# Patient Record
Sex: Female | Born: 1975 | Race: White | Hispanic: Yes | Marital: Single | Smoking: Never smoker
Health system: Southern US, Community
[De-identification: ages and names within clinical notes are randomized; demographics above are authoritative.]

## PROBLEM LIST (undated history)

## (undated) DIAGNOSIS — I10 Essential (primary) hypertension: Secondary | ICD-10-CM

## (undated) HISTORY — PX: TUBAL LIGATION: SHX77

## (undated) HISTORY — PX: ABDOMINAL HYSTERECTOMY: SHX81

## (undated) HISTORY — PX: BREAST SURGERY: SHX581

## (undated) HISTORY — PX: ABDOMINAL SURGERY: SHX537

---

## 2008-03-18 HISTORY — PX: REDUCTION MAMMAPLASTY: SUR839

## 2017-04-20 ENCOUNTER — Other Ambulatory Visit: Payer: Self-pay

## 2017-04-20 ENCOUNTER — Emergency Department (HOSPITAL_BASED_OUTPATIENT_CLINIC_OR_DEPARTMENT_OTHER)
Admission: EM | Admit: 2017-04-20 | Discharge: 2017-04-20 | Disposition: A | Discharge status: Home / Self Care | Payer: BLUE CROSS/BLUE SHIELD | Attending: Emergency Medicine | Admitting: Emergency Medicine

## 2017-04-20 ENCOUNTER — Encounter (HOSPITAL_BASED_OUTPATIENT_CLINIC_OR_DEPARTMENT_OTHER): Payer: Self-pay | Admitting: Emergency Medicine

## 2017-04-20 DIAGNOSIS — G5621 Lesion of ulnar nerve, right upper limb: Secondary | ICD-10-CM | POA: Diagnosis not present

## 2017-04-20 DIAGNOSIS — R200 Anesthesia of skin: Secondary | ICD-10-CM | POA: Diagnosis present

## 2017-04-20 DIAGNOSIS — I10 Essential (primary) hypertension: Secondary | ICD-10-CM | POA: Insufficient documentation

## 2017-04-20 HISTORY — DX: Essential (primary) hypertension: I10

## 2017-04-20 NOTE — Discharge Instructions (Addendum)
Take Tylenol 1000 mg 4 times a day for 1 week. This is the maximum dose of Tylenol (acetminophen) you can take from all sources. Please check other over-the-counter medications and prescriptions to ensure you are not taking other medications that contain acetaminophen.  You may also take ibuprofen 400 mg 6 times a day alternating with or at the same time as tylenol.

## 2017-04-20 NOTE — ED Provider Notes (Signed)
[facility] EMERGENCY DEPARTMENT Provider Note   CSN: [id number] Arrival date & time: 04/20/17  1132     History   Chief Complaint Chief Complaint  Patient presents with  . Hand Problem    HPI Jean HailstoneYvette Kruser is a 42 y.o. female.  HPI  42 year old female with recent finding of hypertension presents with concern for left arm numbness in her fourth and fifth fingers.  2 months ago.  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  4 days ago, developed numbness of the left upper extremity extending from the elbow down through her fourth and fifth fingers.  Reports that the symptoms come and go, they are worse with bending her fingers, worse while she is driving, and sometimes she will wake up in the morning with symptoms.  Reports sensation of numbness as well as aching in that area of her arm.  Denies history of trauma, neck pain, fevers, chest pain, shortness of breath, other areas of numbness or weakness.  Reports she does not have weakness in her hand, but does report it feels "tired at times"   Past Medical History:  Diagnosis Date  . Hypertension     There are no active problems to display for this patient.   Past Surgical History:  Procedure Laterality Date  . ABDOMINAL HYSTERECTOMY    . ABDOMINAL SURGERY    . BREAST SURGERY      OB History    No data available       Home Medications    Prior to Admission medications   Not on File    Family History History reviewed. No pertinent family history.  Social History Social History   Tobacco Use  . Smoking status: Never Smoker  . Smokeless tobacco: Never Used  Substance Use Topics  . Alcohol use: Yes    Frequency: Never  . Drug use: No     Allergies   Adhesive [tape] and Penicillins   Review of Systems Review of Systems  Constitutional: Negative for fever.  HENT: Negative for sore throat.   Eyes: Negative for visual disturbance.  Respiratory: Negative  for cough and shortness of breath.   Cardiovascular: Negative for chest pain.  Gastrointestinal: Negative for abdominal pain, nausea and vomiting.  Genitourinary: Negative for difficulty urinating.  Musculoskeletal: Positive for arthralgias. Negative for back pain and neck pain.  Skin: Negative for rash.  Neurological: Positive for numbness. Negative for dizziness, syncope, facial asymmetry, weakness, light-headedness and headaches.     Physical Exam Updated Vital Signs BP (!) 148/105 (BP Location: Right Arm)   Pulse 72   Temp 98.5 F (36.9 C) (Oral)   Resp 18   Ht 5\' 3"  (1.6 m)   Wt 98 kg (216 lb)   SpO2 100%   BMI 38.26 kg/m   Physical Exam  Constitutional: She is oriented to person, place, and time. She appears well-developed and well-nourished. No distress.  HENT:  Head: Normocephalic and atraumatic.  Eyes: Conjunctivae and EOM are normal.  Neck: Normal range of motion.  Cardiovascular: Normal rate, regular rhythm, normal heart sounds and intact distal pulses. Exam reveals no gallop and no friction rub.  No murmur heard. Pulmonary/Chest: Effort normal and breath sounds normal. No respiratory distress. She has no wheezes. She has no rales.  Abdominal: Soft. She exhibits no distension. There is no tenderness. There is no guarding.  Musculoskeletal: She exhibits no edema or tenderness.  Neurological: She is alert and oriented to person,  place, and time. She has normal strength. No cranial nerve deficit or sensory deficit. She displays a negative Romberg sign. Coordination and gait normal. GCS eye subscore is 4. GCS verbal subscore is 5. GCS motor subscore is 6.  5/5 strength finger abduction, opponens, wrist extension and flexion, grip strength, elbow flexion/extension, arm flexion  Normal LE strength  Skin: Skin is warm and dry. No rash noted. She is not diaphoretic. No erythema.  Nursing note and vitals reviewed.    ED Treatments / Results  Labs (all labs ordered are  listed, but only abnormal results are displayed) Labs Reviewed - No data to display  EKG  EKG Interpretation None       Radiology No results found.  Procedures Procedures (including critical care time)  Medications Ordered in ED Medications - No data to display   Initial Impression / Assessment and Plan / ED Course  I have reviewed the triage vital signs and the nursing notes.  Pertinent labs & imaging results that were available during my care of the patient were reviewed by me and considered in my medical decision making (see chart for details).     42 year old female with recent finding of hypertension presents with concern for left arm numbness in her fourth and fifth fingers.  Patient denies neck pain or trauma, low suspicion for cervical spine fracture.  She has good strength and exam, doubt significant cord compression.  No chest pain, no dyspnea, no other neurologic symptoms and have low suspicion for MI, CVA by hx of physical.  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  Recommend ibuprofen, Tylenol, ice over the elbow, as well as rest.  Patient reports blood pressures have been high in the last several times she is checked, does not have a primary care doctor in the area.  Will start low-dose amlodipine, recommend she follow-up with the primary care doctor soon as possible. Patient discharged in stable condition with understanding of reasons to return.    Final Clinical Impressions(s) / ED Diagnoses   Final diagnoses:  Ulnar neuropathy of right upper extremity    ED Discharge Orders    None       Alvira Monday, MD 04/20/17 1659

## 2017-04-20 NOTE — ED Triage Notes (Addendum)
Patient states that she is having numbness and "weird feeling" - to her left hand up to her left elbow region started about 4 days ago . Patient reports that she notes some swelling to her left hand. Was recently had the flu. Patient states that it gets worse when she lifts her hand up and better when it hands down

## 2017-11-21 ENCOUNTER — Encounter (HOSPITAL_BASED_OUTPATIENT_CLINIC_OR_DEPARTMENT_OTHER): Payer: Self-pay | Admitting: *Deleted

## 2017-11-21 ENCOUNTER — Emergency Department (HOSPITAL_BASED_OUTPATIENT_CLINIC_OR_DEPARTMENT_OTHER): Payer: BLUE CROSS/BLUE SHIELD

## 2017-11-21 ENCOUNTER — Ambulatory Visit (HOSPITAL_BASED_OUTPATIENT_CLINIC_OR_DEPARTMENT_OTHER)
Admission: EM | Admit: 2017-11-21 | Discharge: 2017-11-22 | Disposition: A | Discharge status: Home / Self Care | Payer: BLUE CROSS/BLUE SHIELD | Attending: Emergency Medicine | Admitting: Emergency Medicine

## 2017-11-21 ENCOUNTER — Other Ambulatory Visit: Payer: Self-pay

## 2017-11-21 ENCOUNTER — Encounter (HOSPITAL_COMMUNITY)
Admission: EM | Disposition: A | Discharge status: Home / Self Care | Payer: Self-pay | Source: Home / Self Care | Attending: Emergency Medicine

## 2017-11-21 DIAGNOSIS — Z79899 Other long term (current) drug therapy: Secondary | ICD-10-CM | POA: Insufficient documentation

## 2017-11-21 DIAGNOSIS — I10 Essential (primary) hypertension: Secondary | ICD-10-CM | POA: Insufficient documentation

## 2017-11-21 DIAGNOSIS — N83511 Torsion of right ovary and ovarian pedicle: Secondary | ICD-10-CM | POA: Diagnosis not present

## 2017-11-21 DIAGNOSIS — N838 Other noninflammatory disorders of ovary, fallopian tube and broad ligament: Secondary | ICD-10-CM | POA: Diagnosis not present

## 2017-11-21 DIAGNOSIS — N83519 Torsion of ovary and ovarian pedicle, unspecified side: Secondary | ICD-10-CM | POA: Diagnosis present

## 2017-11-21 LAB — URINALYSIS, ROUTINE W REFLEX MICROSCOPIC
BILIRUBIN URINE: NEGATIVE
GLUCOSE, UA: NEGATIVE mg/dL
Hgb urine dipstick: NEGATIVE
KETONES UR: NEGATIVE mg/dL
LEUKOCYTES UA: NEGATIVE
NITRITE: NEGATIVE
PROTEIN: NEGATIVE mg/dL
Specific Gravity, Urine: 1.015 (ref 1.005–1.030)
pH: 7.5 (ref 5.0–8.0)

## 2017-11-21 LAB — BASIC METABOLIC PANEL
ANION GAP: 9 (ref 5–15)
BUN: 12 mg/dL (ref 6–20)
CALCIUM: 8.5 mg/dL — AB (ref 8.9–10.3)
CO2: 24 mmol/L (ref 22–32)
CREATININE: 0.54 mg/dL (ref 0.44–1.00)
Chloride: 105 mmol/L (ref 98–111)
GFR calc Af Amer: >60 mL/min (ref >60)
GLUCOSE: 82 mg/dL (ref 70–99)
Potassium: 3.7 mmol/L (ref 3.5–5.1)
Sodium: 138 mmol/L (ref 135–145)

## 2017-11-21 LAB — CBC
HCT: 39.6 % (ref 36.0–46.0)
HEMOGLOBIN: 13.2 g/dL (ref 12.0–15.0)
MCH: 30.8 pg (ref 26.0–34.0)
MCHC: 33.3 g/dL (ref 30.0–36.0)
MCV: 92.5 fL (ref 78.0–100.0)
PLATELETS: 255 10*3/uL (ref 150–400)
RBC: 4.28 MIL/uL (ref 3.87–5.11)
RDW: 12.9 % (ref 11.5–15.5)
WBC: 13 10*3/uL — ABNORMAL HIGH (ref 4.0–10.5)

## 2017-11-21 LAB — PREGNANCY, URINE: PREG TEST UR: NEGATIVE

## 2017-11-21 SURGERY — OOPHORECTOMY, LAPAROSCOPIC
Anesthesia: General | Laterality: Right

## 2017-11-21 MED ORDER — PROPOFOL 10 MG/ML IV BOLUS
INTRAVENOUS | Status: AC
  Filled 2017-11-21: qty 20

## 2017-11-21 MED ORDER — MORPHINE SULFATE (PF) 4 MG/ML IV SOLN
4.0000 mg | Freq: Once | INTRAVENOUS | Status: AC
  Administered 2017-11-21: 4 mg via INTRAVENOUS
  Filled 2017-11-21: qty 1

## 2017-11-21 MED ORDER — MIDAZOLAM HCL 2 MG/2ML IJ SOLN
INTRAMUSCULAR | Status: AC
  Filled 2017-11-21: qty 2

## 2017-11-21 MED ORDER — LIDOCAINE HCL (CARDIAC) PF 100 MG/5ML IV SOSY
PREFILLED_SYRINGE | INTRAVENOUS | Status: AC
  Filled 2017-11-21: qty 5

## 2017-11-21 MED ORDER — ONDANSETRON HCL 4 MG/2ML IJ SOLN
4.0000 mg | Freq: Once | INTRAMUSCULAR | Status: AC
  Administered 2017-11-21: 4 mg via INTRAVENOUS
  Filled 2017-11-21: qty 2

## 2017-11-21 MED ORDER — ONDANSETRON HCL 4 MG/2ML IJ SOLN
INTRAMUSCULAR | Status: AC
  Filled 2017-11-21: qty 2

## 2017-11-21 MED ORDER — ROCURONIUM BROMIDE 100 MG/10ML IV SOLN
INTRAVENOUS | Status: AC
  Filled 2017-11-21: qty 1

## 2017-11-21 MED ORDER — FENTANYL CITRATE (PF) 250 MCG/5ML IJ SOLN
INTRAMUSCULAR | Status: AC
  Filled 2017-11-21: qty 5

## 2017-11-21 MED ORDER — LACTATED RINGERS IV SOLN
INTRAVENOUS | Status: DC
  Administered 2017-11-22 (×2): via INTRAVENOUS

## 2017-11-21 MED ORDER — FENTANYL CITRATE (PF) 100 MCG/2ML IJ SOLN
INTRAMUSCULAR | Status: AC
  Filled 2017-11-21: qty 2

## 2017-11-21 SURGICAL SUPPLY — 31 items
APPLICATOR ARISTA FLEXITIP XL (MISCELLANEOUS) ×4 IMPLANT
BLADE SURG 15 STRL LF C SS BP (BLADE) ×2 IMPLANT
BLADE SURG 15 STRL SS (BLADE) ×2
DERMABOND ADVANCED (GAUZE/BANDAGES/DRESSINGS) ×2
DERMABOND ADVANCED .7 DNX12 (GAUZE/BANDAGES/DRESSINGS) ×2 IMPLANT
DRSG OPSITE POSTOP 3X4 (GAUZE/BANDAGES/DRESSINGS) ×4 IMPLANT
DURAPREP 26ML APPLICATOR (WOUND CARE) ×4 IMPLANT
GLOVE BIOGEL PI IND STRL 6.5 (GLOVE) ×4 IMPLANT
GLOVE BIOGEL PI IND STRL 7.0 (GLOVE) ×4 IMPLANT
GLOVE BIOGEL PI INDICATOR 6.5 (GLOVE) ×4
GLOVE BIOGEL PI INDICATOR 7.0 (GLOVE) ×4
GLOVE SURG SS PI 6.0 STRL IVOR (GLOVE) ×4 IMPLANT
GOWN STRL REUS W/TWL LRG LVL3 (GOWN DISPOSABLE) ×8 IMPLANT
HEMOSTAT ARISTA ABSORB 3G PWDR (MISCELLANEOUS) ×4 IMPLANT
NS IRRIG 1000ML POUR BTL (IV SOLUTION) ×4 IMPLANT
PACK LAPAROSCOPY BASIN (CUSTOM PROCEDURE TRAY) ×4 IMPLANT
PACK TRENDGUARD 450 HYBRID PRO (MISCELLANEOUS) ×2 IMPLANT
POUCH SPECIMEN RETRIEVAL 10MM (ENDOMECHANICALS) ×4 IMPLANT
PROTECTOR NERVE ULNAR (MISCELLANEOUS) ×8 IMPLANT
SET IRRIG TUBING LAPAROSCOPIC (IRRIGATION / IRRIGATOR) ×4 IMPLANT
SHEARS HARMONIC ACE PLUS 36CM (ENDOMECHANICALS) ×4 IMPLANT
SLEEVE XCEL OPT CAN 5 100 (ENDOMECHANICALS) ×4 IMPLANT
SUT MNCRL AB 4-0 PS2 18 (SUTURE) ×4 IMPLANT
SUT VICRYL 0 UR6 27IN ABS (SUTURE) ×8 IMPLANT
TOWEL OR 17X24 6PK STRL BLUE (TOWEL DISPOSABLE) ×8 IMPLANT
TRAY FOLEY W/BAG SLVR 14FR (SET/KITS/TRAYS/PACK) ×4 IMPLANT
TRENDGUARD 450 HYBRID PRO PACK (MISCELLANEOUS) ×4
TROCAR BALLN 12MMX100 BLUNT (TROCAR) ×4 IMPLANT
TROCAR XCEL NON-BLD 5MMX100MML (ENDOMECHANICALS) ×4 IMPLANT
TUBING INSUF HEATED (TUBING) ×4 IMPLANT
WARMER LAPAROSCOPE (MISCELLANEOUS) ×4 IMPLANT

## 2017-11-21 NOTE — ED Triage Notes (Signed)
Right flank pain today.

## 2017-11-21 NOTE — ED Notes (Signed)
Tiffany, RN from [facility] was given report

## 2017-11-21 NOTE — ED Notes (Signed)
[facility] notified (Tammy) - transport to [facility] (Dr. Catalina Antigua accepting)

## 2017-11-21 NOTE — Anesthesia Preprocedure Evaluation (Addendum)
Anesthesia Evaluation  Patient identified by MRN, date of birth, ID band Patient awake    Reviewed: Allergy & Precautions, NPO status , Patient's Chart, lab work & pertinent test results  Airway Mallampati: I       Dental no notable dental hx. (+) Teeth Intact   Pulmonary neg pulmonary ROS,    Pulmonary exam normal breath sounds clear to auscultation       Cardiovascular hypertension, Normal cardiovascular exam Rhythm:Regular Rate:Normal     Neuro/Psych negative neurological ROS     GI/Hepatic negative GI ROS, Neg liver ROS,   Endo/Other  negative endocrine ROS  Renal/GU negative Renal ROS  negative genitourinary   Musculoskeletal negative musculoskeletal ROS (+)   Abdominal (+) + obese,   Peds  Hematology negative hematology ROS (+)   Anesthesia Other Findings   Reproductive/Obstetrics negative OB ROS                           Anesthesia Physical Anesthesia Plan  ASA: II  Anesthesia Plan: General   Post-op Pain Management:    Induction: Intravenous  PONV Risk Score and Plan: 4 or greater and Ondansetron, Dexamethasone, Scopolamine patch - Pre-op and Midazolam  Airway Management Planned: Oral ETT  Additional Equipment:   Intra-op Plan:   Post-operative Plan: Extubation in OR  Informed Consent: I have reviewed the patients History and Physical, chart, labs and discussed the procedure including the risks, benefits and alternatives for the proposed anesthesia with the patient or authorized representative who has indicated his/her understanding and acceptance.   Dental advisory given  Plan Discussed with: CRNA and Surgeon  Anesthesia Plan Comments:        Anesthesia Quick Evaluation

## 2017-11-21 NOTE — ED Provider Notes (Signed)
[facility] EMERGENCY DEPARTMENT Provider Note   CSN: [id number] Arrival date & time: 11/21/17  1658     History   Chief Complaint Chief Complaint  Patient presents with  . Flank Pain    HPI Jean Graves is a 42 y.o. female.  Who presents the emergency department chief complaint of right flank pain.  The patient works third shift and states around 1 PM she was awoken from sleep with right flank pain radiating into her right pelvis and urgency to urinate.  Patient states that the pain was just nagging at that time but became severe, stabbing and she has associated nausea without vomiting.  She had difficulty finding a comfortable position.  She denies hematuria, fevers, chills.  Nothing seems to make the pain worse or better.  She rates the pain at 7 out of 10.   She denies any known injuries.  It does not seem to be worse with movement or position.   HPI  Past Medical History:  Diagnosis Date  . Hypertension     There are no active problems to display for this patient.   Past Surgical History:  Procedure Laterality Date  . ABDOMINAL HYSTERECTOMY    . ABDOMINAL SURGERY    . BREAST SURGERY       OB History   None      Home Medications    Prior to Admission medications   Not on File    Family History No family history on file.  Social History Social History   Tobacco Use  . Smoking status: Never Smoker  . Smokeless tobacco: Never Used  Substance Use Topics  . Alcohol use: Yes    Frequency: Never  . Drug use: No     Allergies   Adhesive [tape] and Penicillins   Review of Systems Review of Systems Ten systems reviewed and are negative for acute change, except as noted in the HPI.    Physical Exam Updated Vital Signs BP (!) 160/101   Pulse 77   Temp 98.7 F (37.1 C) (Oral)   Resp 20   Ht 5\' 3"  (1.6 m)   Wt 91.4 kg   SpO2 99%   BMI 35.69 kg/m   Physical Exam  Constitutional: She is oriented to person, place, and time. She  appears well-developed and well-nourished. No distress.  HENT:  Head: Normocephalic and atraumatic.  Eyes: Conjunctivae are normal. No scleral icterus.  Neck: Normal range of motion.  Cardiovascular: Normal rate, regular rhythm and normal heart sounds. Exam reveals no gallop and no friction rub.  No murmur heard. Pulmonary/Chest: Effort normal and breath sounds normal. No respiratory distress.  Abdominal: Soft. Bowel sounds are normal. She exhibits no distension and no mass. There is tenderness. There is no guarding.  No CVA tenderness Tenderness in the right upper and lower abdominal quadrants no rebound  Neurological: She is alert and oriented to person, place, and time.  Skin: Skin is warm and dry. She is not diaphoretic.  Psychiatric: Her behavior is normal.  Nursing note and vitals reviewed.    ED Treatments / Results  Labs (all labs ordered are listed, but only abnormal results are displayed) Labs Reviewed  URINALYSIS, ROUTINE W REFLEX MICROSCOPIC    EKG None  Radiology No results found.  Procedures Procedures (including critical care time)  Medications Ordered in ED Medications - No data to display   Initial Impression / Assessment and Plan / ED Course  I have reviewed the triage vital signs  and the nursing notes.  Pertinent labs & imaging results that were available during my care of the patient were reviewed by me and considered in my medical decision making (see chart for details).   Clinical Course as of Nov 23 6  Fri Nov 21, 2017  1943 US Pelvis Complete [AH]  2029 Dr. Rennis Petty radiology   [AH]    Clinical Course User Index [AH] Arthor Captain, PA-C   42 year old female with flank and abdominal pain.The differential diagnosis includes renal colic,  Cholelithiasis, cholecystitis, hepatitis, eg, viral, alcoholic, toxic, cholangitis or choledocholithiasis, peptic ulcer disease (duodenal), pancreatitis, functional or nonulcer dyspepsia, liver abscess, liver,  pancreatic, or biliary tract cancer, ischemic hepatopathy (shock liver), hepatic vein obstruction (Budd-Chiari syndrome), right lower lobe pneumonia, pyelonephritis, urinary calculi,  Fitz-Hugh-Curtis syndrome (with pelvic inflammatory disease), liver cell adenoma, herpes zoster, trauma or musculoskeletal pain, herniated disk, abdominal abscess , intestinal ischemia, physical or sexual abuse, ectopic pregnancy, IUP, Mittelschmerz, appendicitis, cholecystitis, ovarian cyst/torsion, threatened/ievitable abortion, PID, endometriosis, molar pregnancy, UTI/renal colic, heterotopic pregnancy, IBD, corpus luteum cyst  Patient's CT scan concerning for torsion  And patient's pelvic ultrasound confirms that diagnosis.  Personally reviewed the images and agree with radiologic interpretation.  I discussed the findings with the patient and I also discussed them with Dr. Catalina Antigua who asked that she be sent her for emergently for surgical intervention.  Patient vital signs are stable.  She she has a slightly elevated white count her her lab work is otherwise unremarkable.  Patient seen and shared visit with Dr. Clarene Duke..  Final Clinical Impressions(s) / ED Diagnoses   Final diagnoses:  None    ED Discharge Orders    None       Arthor Captain, PA-C 11/22/17 0014    Little, Ambrose Finland, MD 11/22/17 2351

## 2017-11-21 NOTE — ED Notes (Signed)
Patient transported to Ultrasound 

## 2017-11-21 NOTE — [facility] Note (Signed)
Started having pain at 1300 today on the right side.  Hysterectomy July 2018.

## 2017-11-21 NOTE — ED Notes (Signed)
Pt on cardiac monitor and auto VS 

## 2017-11-21 NOTE — H&P (Signed)
Jean Graves is an 42 y.o. female [phone] transferred from [facility] for the evaluation of an ovarian torsion. Patient reports onset of pain around 1pm which awakened her from sleep. The pain was throbbing in nature initially and associated with nausea. She states that the intensity of the pain worsened making it difficult to find a comfortable position.   Pertinent Gynecological History: Menses: s/p hysterectomy Bleeding: none DES exposure: denies Blood transfusions: none Sexually transmitted diseases: no past history Previous GYN Procedures: RAVH    Menstrual History: No LMP recorded. Patient has had a hysterectomy.    Past Medical History:  Diagnosis Date  . Hypertension     Past Surgical History:  Procedure Laterality Date  . ABDOMINAL HYSTERECTOMY    . ABDOMINAL SURGERY    . BREAST SURGERY    . TUBAL LIGATION      Family History  Problem Relation Age of Onset  . Diabetes Father   . Diabetes Maternal Grandmother     Social History:  reports that she has never smoked. She has never used smokeless tobacco. She reports that she drinks alcohol. She reports that she does not use drugs.  Allergies:  Allergies  Allergen Reactions  . Adhesive [Tape] Rash  . Penicillins Rash    No medications prior to admission.    ROS See pertinent in HPI Blood pressure (!) 158/92, pulse 69, temperature 98.2 F (36.8 C), temperature source Oral, resp. rate 20, height 5\' 3"  (1.6 m), weight 90.7 kg, SpO2 100 %. Physical Exam GENERAL: Well-developed, well-nourished female in no acute distress.  HEENT: Normocephalic, atraumatic. Sclerae anicteric.  NECK: Supple. Normal thyroid.  LUNGS: Clear to auscultation bilaterally.  HEART: Regular rate and rhythm. BREASTS: Symmetric in size. No palpable masses or lymphadenopathy, skin changes, or nipple drainage. ABDOMEN: Soft, nondistended, positive tenderness in lower abdomen right greater than left. No organomegaly. No  rebound EXTREMITIES: No cyanosis, clubbing, or edema, 2+ distal pulses.  Results for orders placed or performed during the hospital encounter of 11/21/17 (from the past 24 hour(s))  Urinalysis, Routine w reflex microscopic     Status: None   Collection Time: 11/21/17  5:10 PM  Result Value Ref Range   Color, Urine YELLOW YELLOW   APPearance CLEAR CLEAR   Specific Gravity, Urine 1.015 1.005 - 1.030   pH 7.5 5.0 - 8.0   Glucose, UA NEGATIVE NEGATIVE mg/dL   Hgb urine dipstick NEGATIVE NEGATIVE   Bilirubin Urine NEGATIVE NEGATIVE   Ketones, ur NEGATIVE NEGATIVE mg/dL   Protein, ur NEGATIVE NEGATIVE mg/dL   Nitrite NEGATIVE NEGATIVE   Leukocytes, UA NEGATIVE NEGATIVE  Pregnancy, urine     Status: None   Collection Time: 11/21/17  5:10 PM  Result Value Ref Range   Preg Test, Ur NEGATIVE NEGATIVE  Basic metabolic panel     Status: Abnormal   Collection Time: 11/21/17  5:56 PM  Result Value Ref Range   Sodium 138 135 - 145 mmol/L   Potassium 3.7 3.5 - 5.1 mmol/L   Chloride 105 98 - 111 mmol/L   CO2 24 22 - 32 mmol/L   Glucose, Bld 82 70 - 99 mg/dL   BUN 12 6 - 20 mg/dL   Creatinine, Ser 2.95 0.44 - 1.00 mg/dL   Calcium 8.5 (L) 8.9 - 10.3 mg/dL   GFR calc non Af Amer >60 >60 mL/min   GFR calc Af Amer >60 >60 mL/min   Anion gap 9 5 - 15  CBC  Status: Abnormal   Collection Time: 11/21/17  5:56 PM  Result Value Ref Range   WBC 13.0 (H) 4.0 - 10.5 K/uL   RBC 4.28 3.87 - 5.11 MIL/uL   Hemoglobin 13.2 12.0 - 15.0 g/dL   HCT 47.8 29.5 - 62.1 %   MCV 92.5 78.0 - 100.0 fL   MCH 30.8 26.0 - 34.0 pg   MCHC 33.3 30.0 - 36.0 g/dL   RDW 30.8 65.7 - 84.6 %   Platelets 255 150 - 400 K/uL    US Transvaginal Non-ob  Result Date: 11/21/2017 CLINICAL DATA:  Right flank pain. CT findings suspicious for right ovarian thrombosis and possible right ovarian torsion earlier today. EXAM: TRANSABDOMINAL AND TRANSVAGINAL ULTRASOUND OF PELVIS DOPPLER ULTRASOUND OF OVARIES TECHNIQUE: Both  transabdominal and transvaginal ultrasound examinations of the pelvis were performed. Transabdominal technique was performed for global imaging of the pelvis including uterus, ovaries, adnexal regions, and pelvic cul-de-sac. It was necessary to proceed with endovaginal exam following the transabdominal exam to visualize the right ovary and better visualize the uterus and left ovary. Color and duplex Doppler ultrasound was utilized to evaluate blood flow to the ovaries. COMPARISON:  Abdomen and pelvis CT obtained earlier today. FINDINGS: Uterus Surgically absent. Right ovary Measurements: 4.5 x 3.4 x 2.9 cm. Mildly heterogeneous. Almost no internal blood flow with color Doppler. Only a tiny amount of was seen in the central portion of the ovary. Left ovary Measurements: 3.4 x 2.6 x 2.2 cm. 2.4 x 2.1 x 2.0 cm oval cyst containing heterogeneous internal echoes. No internal blood flow within the cyst with color Doppler. Pulsed Doppler evaluation of both ovaries demonstrates normal low-resistance arterial and venous waveforms in the left ovary. There was minimal internal arterial and venous flow within the central right ovary. Other findings Trace amount of free peritoneal fluid adjacent to the right ovary. IMPRESSION: 1. Findings concerning for right ovarian torsion with near-complete absence of blood flow in the ovary. 2. 2.4 cm complex left ovarian cyst. This most likely represents a hemorrhagic cyst. An endometrioma could also have this appearance. A follow-up pelvic ultrasound is recommended in 3 months. 3. Status post hysterectomy. Critical Value/emergent results were called by telephone at the time of interpretation on 11/21/2017 at 8:29 pm to Francis Gaines, PA-C, who verbally acknowledged these results. Electronically Signed   By: Beckie Salts M.D.   On: 11/21/2017 20:32   US Pelvis Complete  Result Date: 11/21/2017 CLINICAL DATA:  Right flank pain. CT findings suspicious for right ovarian thrombosis and  possible right ovarian torsion earlier today. EXAM: TRANSABDOMINAL AND TRANSVAGINAL ULTRASOUND OF PELVIS DOPPLER ULTRASOUND OF OVARIES TECHNIQUE: Both transabdominal and transvaginal ultrasound examinations of the pelvis were performed. Transabdominal technique was performed for global imaging of the pelvis including uterus, ovaries, adnexal regions, and pelvic cul-de-sac. It was necessary to proceed with endovaginal exam following the transabdominal exam to visualize the right ovary and better visualize the uterus and left ovary. Color and duplex Doppler ultrasound was utilized to evaluate blood flow to the ovaries. COMPARISON:  Abdomen and pelvis CT obtained earlier today. FINDINGS: Uterus Surgically absent. Right ovary Measurements: 4.5 x 3.4 x 2.9 cm. Mildly heterogeneous. Almost no internal blood flow with color Doppler. Only a tiny amount of was seen in the central portion of the ovary. Left ovary Measurements: 3.4 x 2.6 x 2.2 cm. 2.4 x 2.1 x 2.0 cm oval cyst containing heterogeneous internal echoes. No internal blood flow within the cyst with color Doppler. Pulsed Doppler evaluation  of both ovaries demonstrates normal low-resistance arterial and venous waveforms in the left ovary. There was minimal internal arterial and venous flow within the central right ovary. Other findings Trace amount of free peritoneal fluid adjacent to the right ovary. IMPRESSION: 1. Findings concerning for right ovarian torsion with near-complete absence of blood flow in the ovary. 2. 2.4 cm complex left ovarian cyst. This most likely represents a hemorrhagic cyst. An endometrioma could also have this appearance. A follow-up pelvic ultrasound is recommended in 3 months. 3. Status post hysterectomy. Critical Value/emergent results were called by telephone at the time of interpretation on 11/21/2017 at 8:29 pm to Francis Gaines, PA-C, who verbally acknowledged these results. Electronically Signed   By: Beckie Salts M.D.   On: 11/21/2017  20:32   [place] Art/ven Flow Abd Pelv Doppler  Result Date: 11/21/2017 CLINICAL DATA:  Right flank pain. CT findings suspicious for right ovarian thrombosis and possible right ovarian torsion earlier today. EXAM: TRANSABDOMINAL AND TRANSVAGINAL ULTRASOUND OF PELVIS DOPPLER ULTRASOUND OF OVARIES TECHNIQUE: Both transabdominal and transvaginal ultrasound examinations of the pelvis were performed. Transabdominal technique was performed for global imaging of the pelvis including uterus, ovaries, adnexal regions, and pelvic cul-de-sac. It was necessary to proceed with endovaginal exam following the transabdominal exam to visualize the right ovary and better visualize the uterus and left ovary. Color and duplex Doppler ultrasound was utilized to evaluate blood flow to the ovaries. COMPARISON:  Abdomen and pelvis CT obtained earlier today. FINDINGS: Uterus Surgically absent. Right ovary Measurements: 4.5 x 3.4 x 2.9 cm. Mildly heterogeneous. Almost no internal blood flow with color Doppler. Only a tiny amount of was seen in the central portion of the ovary. Left ovary Measurements: 3.4 x 2.6 x 2.2 cm. 2.4 x 2.1 x 2.0 cm oval cyst containing heterogeneous internal echoes. No internal blood flow within the cyst with color Doppler. Pulsed Doppler evaluation of both ovaries demonstrates normal low-resistance arterial and venous waveforms in the left ovary. There was minimal internal arterial and venous flow within the central right ovary. Other findings Trace amount of free peritoneal fluid adjacent to the right ovary. IMPRESSION: 1. Findings concerning for right ovarian torsion with near-complete absence of blood flow in the ovary. 2. 2.4 cm complex left ovarian cyst. This most likely represents a hemorrhagic cyst. An endometrioma could also have this appearance. A follow-up pelvic ultrasound is recommended in 3 months. 3. Status post hysterectomy. Critical Value/emergent results were called by telephone at the time of  interpretation on 11/21/2017 at 8:29 pm to Francis Gaines, PA-C, who verbally acknowledged these results. Electronically Signed   By: Beckie Salts M.D.   On: 11/21/2017 20:32   Ct Renal Stone Study  Result Date: 11/21/2017 CLINICAL DATA:  Right flank pain awakening patient from sleep today. EXAM: CT ABDOMEN AND PELVIS WITHOUT CONTRAST TECHNIQUE: Multidetector CT imaging of the abdomen and pelvis was performed following the standard protocol without IV contrast. COMPARISON:  None. FINDINGS: Lower chest: Normal heart size.  Clear lung bases. Hepatobiliary: No focal liver abnormality is seen. No gallstones, gallbladder wall thickening, or biliary dilatation. Pancreas: Unremarkable. No pancreatic ductal dilatation or surrounding inflammatory changes. Spleen: Normal in size without focal abnormality. Adrenals/Urinary Tract: Normal bilateral adrenal glands. Punctate nonobstructing right upper and interpolar calculi measuring up to 2 mm. No hydroureteronephrosis. Left kidney is unremarkable save for minimal renovascular calcification in the interpolar aspect no hydroureteronephrosis. The urinary bladder is decompressed in appearance. Adjacent bilateral phleboliths are seen. Stomach/Bowel: Stomach is within normal limits.  Appendix appears normal. No evidence of bowel wall thickening, distention, or inflammatory changes. Vascular/Lymphatic: Tubular hyperdense structure outlined by edema is seen emanating from about the level of the right renal vein to the right ovary raising concern for right gonadal vein thrombosis. Enlargement of the right ovary to 3.1 x 4 x 2.9 cm also raises concern for possible torsion. The left ovary contains physiologic sized follicles and is normal normal in size and morphology. Reproductive: Status post hysterectomy. Enlargement of the right ovary associated with what may represent right gonadal vein thrombosis. Other: Trace free fluid in pelvis. Small small periumbilical fat containing hernia.  Musculoskeletal: No acute osseous abnormality. IMPRESSION: Hyperdense tubular structure emanating from an enlarged right ovary to the region of the right renal vein suspicious for right canal vein thrombosis outlined by edema and fluid. Enlargement of the right ovary raises concern for possible torsion and should be correlated with ultrasound. Further correlation for thrombosis can be performed with repeat CT and IV contrast enhancement. These results were called by telephone at the time of interpretation on 11/21/2017 at 6:37 pm to Dr. Clarene Duke for Arthor Captain, PA, who verbally acknowledged these results. Nonobstructing punctate right-sided and interpolar renal calculi. Electronically Signed   By: Tollie Eth M.D.   On: 11/21/2017 18:37    Assessment/Plan: 42 yo with radiologic findings suspicious for right ovarian torsion and abdominal pain - Discussed surgical management with diagnostic laparoscopy and possible right salpingo-oophorectomy - Risks, benefits and alternatives were explained including but not limited to risks of bleeding, infection and damage to adjacent organs. Patient verbalized understanding and all questions were answered. Consent signed  Kasumi Ditullio 11/21/2017, 11:13 PM

## 2017-11-22 ENCOUNTER — Emergency Department (HOSPITAL_COMMUNITY): Payer: BLUE CROSS/BLUE SHIELD | Admitting: Anesthesiology

## 2017-11-22 DIAGNOSIS — N83519 Torsion of ovary and ovarian pedicle, unspecified side: Secondary | ICD-10-CM

## 2017-11-22 LAB — TYPE AND SCREEN
ABO/RH(D): A POS
Antibody Screen: NEGATIVE

## 2017-11-22 LAB — ABO/RH: ABO/RH(D): A POS

## 2017-11-22 MED ORDER — GLYCOPYRROLATE 0.2 MG/ML IJ SOLN
INTRAMUSCULAR | Status: DC | PRN
  Administered 2017-11-22: 0.2 mg via INTRAVENOUS

## 2017-11-22 MED ORDER — FENTANYL CITRATE (PF) 100 MCG/2ML IJ SOLN
INTRAMUSCULAR | Status: DC | PRN
  Administered 2017-11-22: 50 ug via INTRAVENOUS
  Administered 2017-11-22 (×2): 100 ug via INTRAVENOUS

## 2017-11-22 MED ORDER — EPHEDRINE 5 MG/ML INJ
INTRAVENOUS | Status: AC
  Filled 2017-11-22: qty 10

## 2017-11-22 MED ORDER — ONDANSETRON HCL 4 MG/2ML IJ SOLN
INTRAMUSCULAR | Status: DC | PRN
  Administered 2017-11-22: 4 mg via INTRAVENOUS

## 2017-11-22 MED ORDER — BUPIVACAINE HCL (PF) 0.25 % IJ SOLN
INTRAMUSCULAR | Status: DC | PRN
  Administered 2017-11-22: 30 mL

## 2017-11-22 MED ORDER — DEXAMETHASONE SODIUM PHOSPHATE 10 MG/ML IJ SOLN
INTRAMUSCULAR | Status: AC
  Filled 2017-11-22: qty 1

## 2017-11-22 MED ORDER — DEXAMETHASONE SODIUM PHOSPHATE 10 MG/ML IJ SOLN
INTRAMUSCULAR | Status: DC | PRN
  Administered 2017-11-22: 10 mg via INTRAVENOUS

## 2017-11-22 MED ORDER — GLYCOPYRROLATE 0.2 MG/ML IJ SOLN
INTRAMUSCULAR | Status: AC
  Filled 2017-11-22: qty 1

## 2017-11-22 MED ORDER — PROMETHAZINE HCL 25 MG/ML IJ SOLN: 6.2500 mg | INTRAMUSCULAR | Status: DC | PRN

## 2017-11-22 MED ORDER — MEPERIDINE HCL 25 MG/ML IJ SOLN: 6.2500 mg | INTRAMUSCULAR | Status: DC | PRN

## 2017-11-22 MED ORDER — KETOROLAC TROMETHAMINE 30 MG/ML IJ SOLN
INTRAMUSCULAR | Status: DC | PRN
  Administered 2017-11-22: 30 mg via INTRAVENOUS

## 2017-11-22 MED ORDER — MIDAZOLAM HCL 2 MG/2ML IJ SOLN
INTRAMUSCULAR | Status: DC | PRN
  Administered 2017-11-22: 2 mg via INTRAVENOUS

## 2017-11-22 MED ORDER — SODIUM CHLORIDE 0.9 % IR SOLN
Status: DC | PRN
  Administered 2017-11-22: 3000 mL

## 2017-11-22 MED ORDER — IBUPROFEN 600 MG PO TABS: 600.0000 mg | ORAL_TABLET | Freq: Four times a day (QID) | ORAL | 3 refills | Status: AC | PRN

## 2017-11-22 MED ORDER — DOCUSATE SODIUM 100 MG PO CAPS: 100.0000 mg | ORAL_CAPSULE | Freq: Two times a day (BID) | ORAL | 2 refills | Status: AC | PRN

## 2017-11-22 MED ORDER — PROPOFOL 10 MG/ML IV BOLUS
INTRAVENOUS | Status: DC | PRN
  Administered 2017-11-22: 200 mg via INTRAVENOUS

## 2017-11-22 MED ORDER — SCOPOLAMINE 1 MG/3DAYS TD PT72
MEDICATED_PATCH | TRANSDERMAL | Status: DC | PRN
  Administered 2017-11-22: 1 via TRANSDERMAL

## 2017-11-22 MED ORDER — 0.9 % SODIUM CHLORIDE (POUR BTL) OPTIME
TOPICAL | Status: DC | PRN
  Administered 2017-11-22: 1000 mL

## 2017-11-22 MED ORDER — KETOROLAC TROMETHAMINE 30 MG/ML IJ SOLN: 30.0000 mg | Freq: Once | INTRAMUSCULAR | Status: DC | PRN

## 2017-11-22 MED ORDER — EPHEDRINE SULFATE 50 MG/ML IJ SOLN
INTRAMUSCULAR | Status: DC | PRN
  Administered 2017-11-22: 5 mg via INTRAVENOUS

## 2017-11-22 MED ORDER — OXYCODONE-ACETAMINOPHEN 5-325 MG PO TABS: 1.0000 | ORAL_TABLET | Freq: Four times a day (QID) | ORAL | 0 refills | Status: AC | PRN

## 2017-11-22 MED ORDER — LIDOCAINE HCL (CARDIAC) PF 100 MG/5ML IV SOSY
PREFILLED_SYRINGE | INTRAVENOUS | Status: DC | PRN
  Administered 2017-11-22: 100 mg via INTRAVENOUS

## 2017-11-22 MED ORDER — HYDROMORPHONE HCL 1 MG/ML IJ SOLN: 0.2500 mg | INTRAMUSCULAR | Status: DC | PRN

## 2017-11-22 MED ORDER — ROCURONIUM BROMIDE 100 MG/10ML IV SOLN
INTRAVENOUS | Status: DC | PRN
  Administered 2017-11-22: 40 mg via INTRAVENOUS

## 2017-11-22 MED ORDER — SUGAMMADEX SODIUM 200 MG/2ML IV SOLN
INTRAVENOUS | Status: DC | PRN
  Administered 2017-11-22: 200 mg via INTRAVENOUS

## 2017-11-22 NOTE — Discharge Instructions (Signed)
Laparoscopic Ovarian Torsion Surgery, Care After °Refer to this sheet in the next few weeks. These instructions provide you with information about caring for yourself after your procedure. Your health care provider may also give you more specific instructions. Your treatment has been planned according to current medical practices, but problems sometimes occur. Call your health care provider if you have any problems or questions after your procedure. °What can I expect after the procedure? °After your procedure, it is common to have: °· A small amount of blood or clear fluid coming from the cuts made during surgery (incisions). °· Mild cramping in your lower abdomen. ° °Follow these instructions at home: °Incision care ° °· Follow instructions from your health care provider about how to take care of your incisions. Make sure you: °? Wash your hands with soap and water before you change your bandage (dressing). If soap and water are not available, use hand sanitizer. °? Change your dressing as told by your health care provider. °? Leave stitches (sutures), skin glue, or adhesive strips in place. These skin closures may need to be in place for 2 weeks or longer. If adhesive strip edges start to loosen and curl up, you may trim the loose edges. Do not remove adhesive strips completely unless your health care provider tells you to do that. °· Check your incision areas every day for signs of infection. Check for: °? More redness, swelling, or pain. °? More fluid or blood. °? Warmth. °? Pus or a bad smell. °· Keep your incision areas clean and dry. Do not take baths, swim, or use a hot tub until your health care provider approves. °General instructions °· Take over-the-counter and prescription medicines only as told by your health care provider. °· Return to your normal activities as told by your health care provider. Ask your health care provider what activities are safe for you. Ask about: °? Returning to work and  exercise. °? If you have any weight restrictions. °? When it is okay to resume sexual activity. °· Drink enough fluid to keep your urine clear or pale yellow. °· Keep all follow-up visits as told by your health care provider. This is important. °Contact a health care provider if: °· You have unusual bleeding or discharge from your vagina. °· You have pain in your abdomen that gets worse or does not get better with medicine. °· You feel nauseous. °· You have more redness, swelling, or pain at the site of your incisions. °· You have more fluid or blood coming from your incisions. °· Your incisions feel warm to the touch. °· Your have pus or a bad smell coming from your incisions. °Get help right away if: °· You have a fever. °· You have a lot of unusual bleeding or discharge from your vagina. °· You have severe pain in your abdomen. °· You cannot stop vomiting. °· You have nausea that does not get better. °This information is not intended to replace advice given to you by your health care provider. Make sure you discuss any questions you have with your health care provider. °Document Released: 02/21/2011 Document Revised: 11/01/2015 Document Reviewed: 02/13/2015 °Elsevier Interactive Patient Education © 2018 [employer] ° °

## 2017-11-22 NOTE — Anesthesia Procedure Notes (Signed)
Procedure Name: Intubation Date/Time: 11/22/2017 12:19 AM Performed by: Genevie Ann, CRNA Pre-anesthesia Checklist: Patient identified, Emergency Drugs available, Suction available, Patient being monitored and Timeout performed Patient Re-evaluated:Patient Re-evaluated prior to induction Oxygen Delivery Method: Circle system utilized Preoxygenation: Pre-oxygenation with 100% oxygen Induction Type: IV induction Ventilation: Mask ventilation without difficulty Laryngoscope Size: Mac and 3 Grade View: Grade I Tube size: 7.0 mm Number of attempts: 1 Placement Confirmation: ETT inserted through vocal cords under direct vision and positive ETCO2 Secured at: 22 cm Dental Injury: Teeth and Oropharynx as per pre-operative assessment

## 2017-11-22 NOTE — Transfer of Care (Signed)
Immediate Anesthesia Transfer of Care Note  Patient: Jean Graves  Procedure(s) Performed: LAPAROSCOPIC OOPHORECTOMY (Right )  Patient Location: PACU  Anesthesia Type:General  Level of Consciousness: awake, alert  and oriented  Airway & Oxygen Therapy: Patient Spontanous Breathing and Patient connected to nasal cannula oxygen  Post-op Assessment: Report given to RN and Post -op Vital signs reviewed and stable  Post vital signs: Reviewed and stable  Last Vitals:  Vitals Value Taken Time  BP    Temp    Pulse 93 11/22/2017  1:25 AM  Resp    SpO2 97 % 11/22/2017  1:25 AM  Vitals shown include unvalidated device data.  Last Pain:  Vitals:   11/21/17 2233  TempSrc:   PainSc: 3       Patients Stated Pain Goal: 3 (11/21/17 2233)  Complications: No apparent anesthesia complications

## 2017-11-22 NOTE — Op Note (Signed)
[facility] Laffoon PROCEDURE DATE: 11/22/2017  PREOPERATIVE DIAGNOSES: right ovarian torsion POSTOPERATIVE DIAGNOSES: The same PROCEDURE: Laparoscopic right oophorectomy SURGEON:  Dr. Catalina Antigua ASSISTANT: Dr. none   INDICATIONS: 42 y.o. [id number] with aforementioned preoperative diagnoses here today for definitive surgical management.   Risks of surgery were discussed with the patient including but not limited to: bleeding which may require transfusion or reoperation; infection which may require antibiotics; injury to bowel, bladder, ureters or other surrounding organs; need for additional procedures including laparotomy; thromboembolic phenomenon, incisional problems and other postoperative/anesthesia complications. Written informed consent was obtained.    FINDINGS:  Absent uterus and bilateral fallopian tubes. Normal left ovary without any evidence of ovarian cyst. Engorged right infundibular ligament with evidence of torsion. Purple and necrotic appearing right ovary. No evidence of reperfusion upon releasing the torsion.  No evidence of endometriosis, adhesions or any other abdominal/pelvic abnormality.  Normal upper abdomen.  ANESTHESIA:    General INTRAVENOUS FLUIDS: 1400 ml ESTIMATED BLOOD LOSS: 10 ml SPECIMENS:  right ovary  COMPLICATIONS: None immediate   PROCEDURE IN DETAIL:  The patient was taken to the operating room where general anesthesia was administered and was found to be adequate.  She was placed in the supine position, and was prepped and draped in a sterile manner.  A Foley catheter was inserted into her bladder and attached to Jonathen Rathman drainage.  After an adequate timeout was performed, attention was then turned to the patient's abdomen where a 11-mm skin incision was made in her previously made supra-umbilical incision.  The fascia was identified, grasped with Kocher clamps, incised and tagged with 0-Vicryl.  The 11-mm trocar and sleeve were then advanced without difficulty  into the abdomen without difficulty and intraabdominal placement was confirmed by the laparoscope. A survey of the patient's pelvis and abdomen revealed the findings above.   Two left lower quadrant ports were placed over her previous incisions: one 2 cm above the superior iliac spine and the other 5 cm cephalad from the previous.  Using the Harmonic device, the right infundibulopelvic ligament was clamped and transected allowing for oophorectomy.  Excellent hemostasis was noted. The specimen was then removed from the abdomen through the 11-mm port using an Endocatch bag, under direct visualization.  The operative site was surveyed, and it was found to be hemostatic.  No intraoperative injury to other surrounding organs was noted.  The abdomen was desufflated and all instruments were then removed from the patient's abdomen. The fascial incision of the umbilicus was closed with a 0 Vicryl figure of eight stitch.  All skin incisions were closed with 3-0 Vicryl subcuticular stitches/Dermabond.   The patient will be discharged to home as per PACU criteria.  Routine postoperative instructions given.  She was prescribed Percocet, Ibuprofen and Colace.  She will follow up in the clinic in 2 weeks for postoperative evaluation.

## 2017-11-22 NOTE — Anesthesia Postprocedure Evaluation (Signed)
Anesthesia Post Note  Patient: Jean Graves  Procedure(s) Performed: LAPAROSCOPIC OOPHORECTOMY (Right )     Patient location during evaluation: PACU Anesthesia Type: General Level of consciousness: awake Pain management: pain level controlled Vital Signs Assessment: post-procedure vital signs reviewed and stable Respiratory status: spontaneous breathing Cardiovascular status: stable Postop Assessment: no apparent nausea or vomiting Anesthetic complications: no    Last Vitals:  Vitals:   11/22/17 0200 11/22/17 0215  BP: (!) 164/98 (!) 145/99  Pulse: 73 85  Resp: 14 16  Temp:  36.7 C  SpO2: 95% 99%    Last Pain:  Vitals:   11/22/17 0215  TempSrc:   PainSc: 0-No pain   Pain Goal: Patients Stated Pain Goal: 3 (11/21/17 2233)               Verlee Pope JR,JOHN Susann Givens

## 2017-11-24 ENCOUNTER — Telehealth: Payer: Self-pay | Admitting: *Deleted

## 2017-11-24 ENCOUNTER — Encounter: Payer: Self-pay | Admitting: *Deleted

## 2017-11-24 NOTE — Telephone Encounter (Signed)
Pt left message stating that she had surgery on 9/6 due to ovarian torsion. She was told that she needs 2 weeks for recovery. She needs a letter for her job stating that she cannot work for 2 weeks. Per chart review, pt has post op appt @ [facility] on 9/25.

## 2017-11-24 NOTE — Addendum Note (Signed)
Addendum  created 11/24/17 1301 by Algis Greenhouse, CRNA   Charge Capture section accepted

## 2017-11-26 NOTE — Telephone Encounter (Signed)
Letter provided

## 2017-12-10 ENCOUNTER — Ambulatory Visit (INDEPENDENT_AMBULATORY_CARE_PROVIDER_SITE_OTHER): Payer: BLUE CROSS/BLUE SHIELD | Admitting: Obstetrics and Gynecology

## 2017-12-10 ENCOUNTER — Encounter: Payer: Self-pay | Admitting: Obstetrics and Gynecology

## 2017-12-10 VITALS — BP 161/124 | HR 84 | Wt 202.2 lb

## 2017-12-10 DIAGNOSIS — R300 Dysuria: Secondary | ICD-10-CM

## 2017-12-10 DIAGNOSIS — Z9889 Other specified postprocedural states: Secondary | ICD-10-CM

## 2017-12-10 LAB — POCT URINALYSIS DIPSTICK
BILIRUBIN UA: NEGATIVE
Blood, UA: NEGATIVE
Glucose, UA: NEGATIVE
Ketones, UA: NEGATIVE
Leukocytes, UA: NEGATIVE
Nitrite, UA: NEGATIVE
Protein, UA: NEGATIVE
Spec Grav, UA: 1.02 (ref 1.010–1.025)
Urobilinogen, UA: 0.2 E.U./dL
pH, UA: 7 (ref 5.0–8.0)

## 2017-12-10 NOTE — Progress Notes (Signed)
Pt is here for post op visit. She is post op day 18 from a laparoscopic right oophorectomy. Pt states she took the dressing off a couple days ago and incision is healing well. Pt states that she is still having some pain in the R side and "tightness".

## 2017-12-10 NOTE — Progress Notes (Signed)
Patient here for post op check s/p Right salpingo-oophorectomy for the treatment of a right ovarian torsion. Patient reports some occasional tightness on her right side and as a result has limited her movements. She denies fever or drainage from her incisions. Patient reports some burning with urination  Past Medical History:  Diagnosis Date  . Hypertension    Past Surgical History:  Procedure Laterality Date  . ABDOMINAL HYSTERECTOMY    . ABDOMINAL SURGERY    . BREAST SURGERY    . TUBAL LIGATION     Family History  Problem Relation Age of Onset  . Diabetes Father   . Diabetes Maternal Grandmother    Social History   Tobacco Use  . Smoking status: Never Smoker  . Smokeless tobacco: Never Used  Substance Use Topics  . Alcohol use: Yes    Frequency: Never  . Drug use: No   ROS See pertinent in HPI  Blood pressure (!) 161/124, pulse 84, weight 202 lb 3.2 oz (91.7 kg). GENERAL: Well-developed, well-nourished female in no acute distress.  ABDOMEN: Soft, nontender, nondistended. No organomegaly. Incision healed well x 3 PELVIC: Normal external female genitalia. Vagina is pink and rugated.  Normal discharge. No adnexal mass or tenderness. EXTREMITIES: No cyanosis, clubbing, or edema, 2+ distal pulses.  A/P 42 yo here for post op check - Patient is healing well She is medically cleared to resume all activities of daily living - urine culture sent to rule out UTI - Patient to follow up with PCP regarding HTN - Patient plans to return for annual exam

## 2017-12-12 LAB — URINE CULTURE: Organism ID, Bacteria: NO GROWTH

## 2018-01-12 ENCOUNTER — Encounter: Payer: Self-pay | Admitting: Obstetrics and Gynecology

## 2018-01-12 ENCOUNTER — Other Ambulatory Visit: Payer: Self-pay | Admitting: Obstetrics and Gynecology

## 2018-01-12 ENCOUNTER — Ambulatory Visit (INDEPENDENT_AMBULATORY_CARE_PROVIDER_SITE_OTHER): Payer: BLUE CROSS/BLUE SHIELD | Admitting: Obstetrics and Gynecology

## 2018-01-12 VITALS — BP 163/120 | HR 72 | Wt 205.0 lb

## 2018-01-12 DIAGNOSIS — Z113 Encounter for screening for infections with a predominantly sexual mode of transmission: Secondary | ICD-10-CM | POA: Diagnosis not present

## 2018-01-12 DIAGNOSIS — B9689 Other specified bacterial agents as the cause of diseases classified elsewhere: Secondary | ICD-10-CM

## 2018-01-12 DIAGNOSIS — Z01419 Encounter for gynecological examination (general) (routine) without abnormal findings: Secondary | ICD-10-CM | POA: Diagnosis not present

## 2018-01-12 DIAGNOSIS — N760 Acute vaginitis: Secondary | ICD-10-CM | POA: Diagnosis not present

## 2018-01-12 DIAGNOSIS — N6310 Unspecified lump in the right breast, unspecified quadrant: Secondary | ICD-10-CM

## 2018-01-12 DIAGNOSIS — N898 Other specified noninflammatory disorders of vagina: Secondary | ICD-10-CM

## 2018-01-12 MED ORDER — HYDROCHLOROTHIAZIDE 25 MG PO TABS: 25.0000 mg | ORAL_TABLET | Freq: Every day | ORAL | 3 refills | Status: AC

## 2018-01-12 NOTE — Progress Notes (Signed)
Pt presents for annual, check vaginal discharge, and all std testing today.  Elevated BP, asymptomatic today. She does not have a PCP.

## 2018-01-12 NOTE — Progress Notes (Signed)
Subjective:     Jean Graves is a 42 y.o. female G63P4 with BMI 36 who is here for a comprehensive physical exam. The patient reports no problems. She denies any pelvic pain or abnormal discharge. She is sexually active without complaints. She had a hysterectomy previously. Patient is without complaints Past Medical History:  Diagnosis Date  . Hypertension    Past Surgical History:  Procedure Laterality Date  . ABDOMINAL HYSTERECTOMY    . ABDOMINAL SURGERY    . BREAST SURGERY    . TUBAL LIGATION     Family History  Problem Relation Age of Onset  . Diabetes Father   . Diabetes Maternal Grandmother   . Alzheimer's disease Maternal Grandmother   . Hypercholesterolemia Mother      Social History   Socioeconomic History  . Marital status: Single    Spouse name: Not on file  . Number of children: Not on file  . Years of education: Not on file  . Highest education level: Not on file  Occupational History  . Not on file  Social Needs  . Financial resource strain: Not on file  . Food insecurity:    Worry: Not on file    Inability: Not on file  . Transportation needs:    Medical: Not on file    Non-medical: Not on file  Tobacco Use  . Smoking status: Never Smoker  . Smokeless tobacco: Never Used  Substance and Sexual Activity  . Alcohol use: Yes    Frequency: Never    Comment: occ  . Drug use: No  . Sexual activity: Yes  Lifestyle  . Physical activity:    Days per week: Not on file    Minutes per session: Not on file  . Stress: Not on file  Relationships  . Social connections:    Talks on phone: Not on file    Gets together: Not on file    Attends religious service: Not on file    Active member of club or organization: Not on file    Attends meetings of clubs or organizations: Not on file    Relationship status: Not on file  . Intimate partner violence:    Fear of current or ex partner: Not on file    Emotionally abused: Not on file    Physically abused: Not  on file    Forced sexual activity: Not on file  Other Topics Concern  . Not on file  Social History Narrative  . Not on file   Health Maintenance  Topic Date Due  . HIV Screening  03/06/1991  . MAMMOGRAM  03/05/1994  . TETANUS/TDAP  03/06/1995  . PAP SMEAR  03/05/1997  . INFLUENZA VACCINE  10/16/2017       Review of Systems Pertinent items are noted in HPI.   Objective:  Blood pressure (!) 163/120, pulse 72, weight 205 lb (93 kg).     GENERAL: Well-developed, well-nourished female in no acute distress.  HEENT: Normocephalic, atraumatic. Sclerae anicteric.  NECK: Supple. Normal thyroid.  LUNGS: Clear to auscultation bilaterally.  HEART: Regular rate and rhythm. BREASTS: Symmetric in size. Palpable 1.5 cm mass on inferior aspect of right breast underlying midline scar, mobile. No lymphadenopathy, skin changes, or nipple drainage. ABDOMEN: Soft, nontender, nondistended. No organomegaly. PELVIC: Normal external female genitalia. Vagina is pink and rugated.  Normal discharge. Vaginal vault intact. No adnexal mass or tenderness. EXTREMITIES: No cyanosis, clubbing, or edema, 2+ distal pulses.    Assessment:    Healthy  female exam.      Plan:    STI screen collected per patient request Rx HCTZ provided Patient to follow up with PCP for further management Screening mammogram ordered. Patient reports having a work up for right breast mass 4 years ago- records requested See After Visit Summary for Counseling Recommendations

## 2018-01-13 LAB — CERVICOVAGINAL ANCILLARY ONLY
Bacterial vaginitis: POSITIVE — AB
CANDIDA VAGINITIS: NEGATIVE
CHLAMYDIA, DNA PROBE: NEGATIVE
NEISSERIA GONORRHEA: NEGATIVE
Trichomonas: NEGATIVE

## 2018-01-13 LAB — HIV ANTIBODY (ROUTINE TESTING W REFLEX): HIV Screen 4th Generation wRfx: NONREACTIVE

## 2018-01-13 LAB — HEPATITIS C ANTIBODY

## 2018-01-13 LAB — HEPATITIS B SURFACE ANTIGEN: Hepatitis B Surface Ag: NEGATIVE

## 2018-01-13 LAB — RPR: RPR Ser Ql: NONREACTIVE

## 2018-01-14 MED ORDER — METRONIDAZOLE 500 MG PO TABS: 500.0000 mg | ORAL_TABLET | Freq: Two times a day (BID) | ORAL | 0 refills | Status: AC

## 2018-01-14 NOTE — Addendum Note (Signed)
Addended by: Catalina Antigua on: 01/14/2018 03:09 PM   Modules accepted: Orders

## 2018-01-26 ENCOUNTER — Ambulatory Visit
Admission: RE | Admit: 2018-01-26 | Discharge: 2018-01-26 | Disposition: A | Discharge status: Home / Self Care | Payer: BLUE CROSS/BLUE SHIELD | Source: Ambulatory Visit | Attending: Obstetrics and Gynecology | Admitting: Obstetrics and Gynecology

## 2018-01-26 DIAGNOSIS — N6310 Unspecified lump in the right breast, unspecified quadrant: Secondary | ICD-10-CM

## 2018-05-25 ENCOUNTER — Emergency Department (HOSPITAL_BASED_OUTPATIENT_CLINIC_OR_DEPARTMENT_OTHER)
Admission: EM | Admit: 2018-05-25 | Discharge: 2018-05-25 | Disposition: A | Discharge status: Home / Self Care | Payer: BLUE CROSS/BLUE SHIELD | Attending: Emergency Medicine | Admitting: Emergency Medicine

## 2018-05-25 ENCOUNTER — Encounter (HOSPITAL_BASED_OUTPATIENT_CLINIC_OR_DEPARTMENT_OTHER): Payer: Self-pay | Admitting: *Deleted

## 2018-05-25 ENCOUNTER — Other Ambulatory Visit: Payer: Self-pay

## 2018-05-25 DIAGNOSIS — R05 Cough: Secondary | ICD-10-CM | POA: Insufficient documentation

## 2018-05-25 DIAGNOSIS — Z5321 Procedure and treatment not carried out due to patient leaving prior to being seen by health care provider: Secondary | ICD-10-CM | POA: Insufficient documentation

## 2018-05-25 NOTE — ED Triage Notes (Signed)
Cough this evening.

## 2018-07-07 ENCOUNTER — Telehealth: Payer: Self-pay | Admitting: Obstetrics and Gynecology

## 2018-07-07 MED ORDER — NITROFURANTOIN MONOHYD MACRO 100 MG PO CAPS: 100.0000 mg | ORAL_CAPSULE | Freq: Two times a day (BID) | ORAL | 0 refills | Status: AC

## 2018-07-07 MED ORDER — PHENAZOPYRIDINE HCL 200 MG PO TABS: 200.0000 mg | ORAL_TABLET | Freq: Three times a day (TID) | ORAL | 0 refills | Status: AC | PRN

## 2018-07-07 NOTE — Telephone Encounter (Signed)
Patient called requesting an appointment for UTI symptoms.  She has burning with urination and pressure after urinating like she still needs to go.  Due to limitations for in office visits because of  COVID.  Rx for Macrobid and Pyridium routed to patient's pharmacy per protocol.

## 2019-08-28 IMAGING — US ULTRASOUND RIGHT BREAST LIMITED
1 series · 4 of 4 positions shown · non-contrast
Comparison: None.

CLINICAL DATA: Patient complains of a palpable abnormality in the
right breast. History of reduction mammoplasty.

EXAM:
DIGITAL DIAGNOSTIC BILATERAL MAMMOGRAM WITH CAD AND TOMO
ULTRASOUND RIGHT BREAST

[Series 1: ultrasound right breast limited · 0.07mm/px · 4 of 4 slices shown]
[im 1/4]
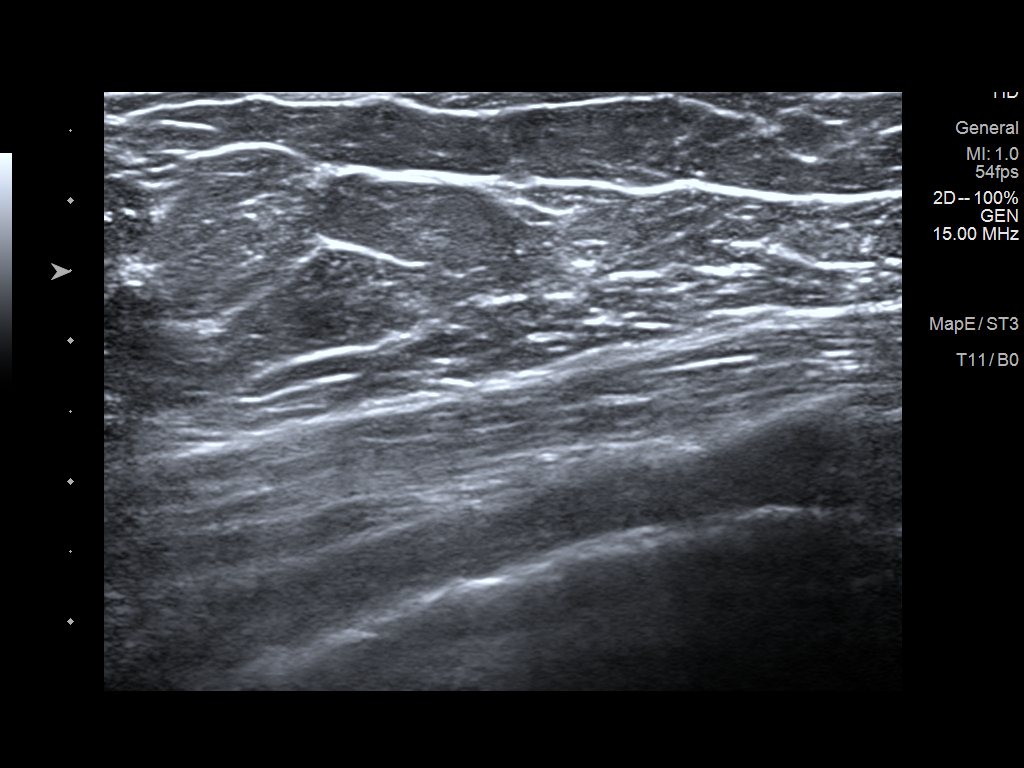
[im 2/4]
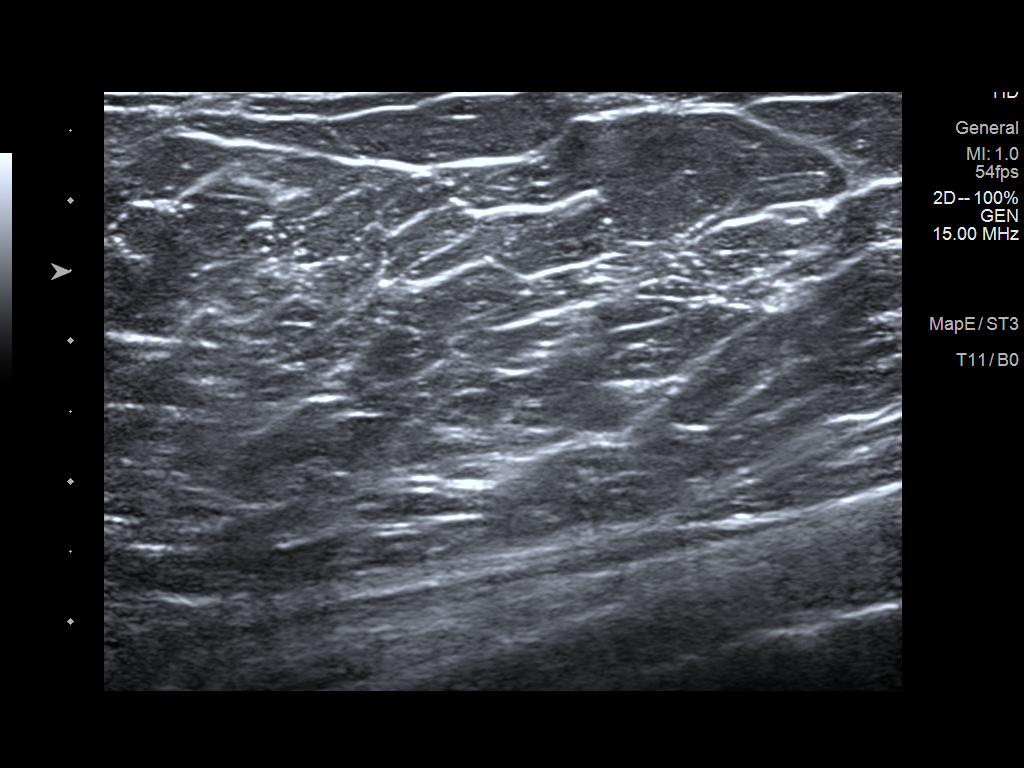
[im 3/4]
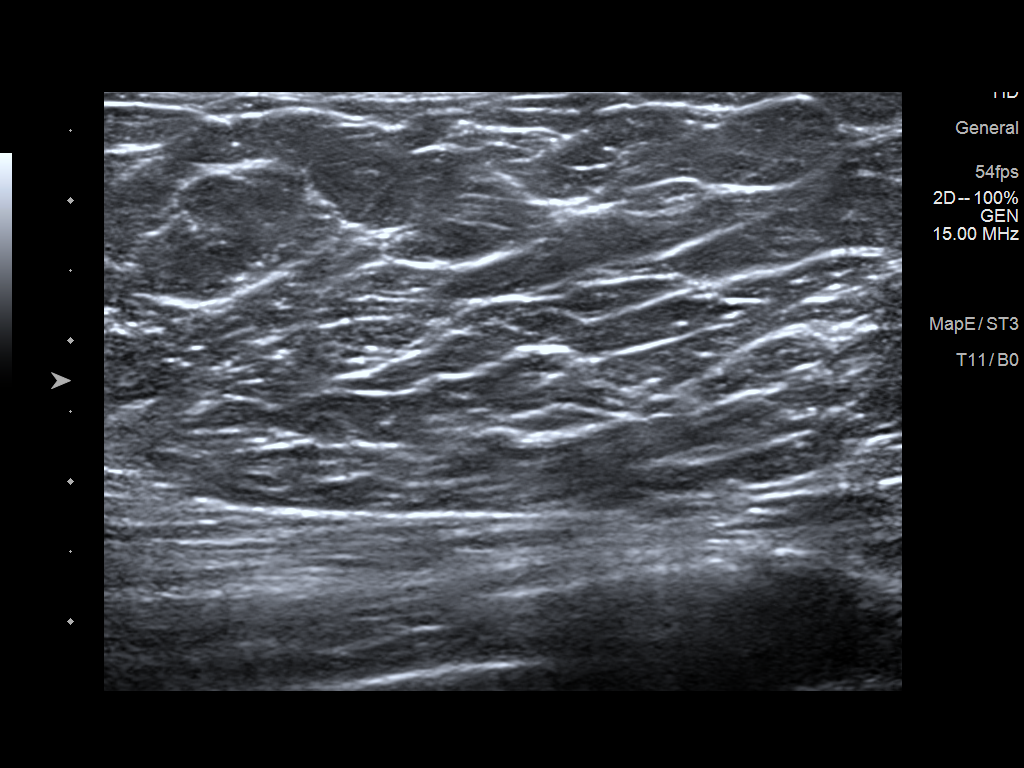
[im 4/4]
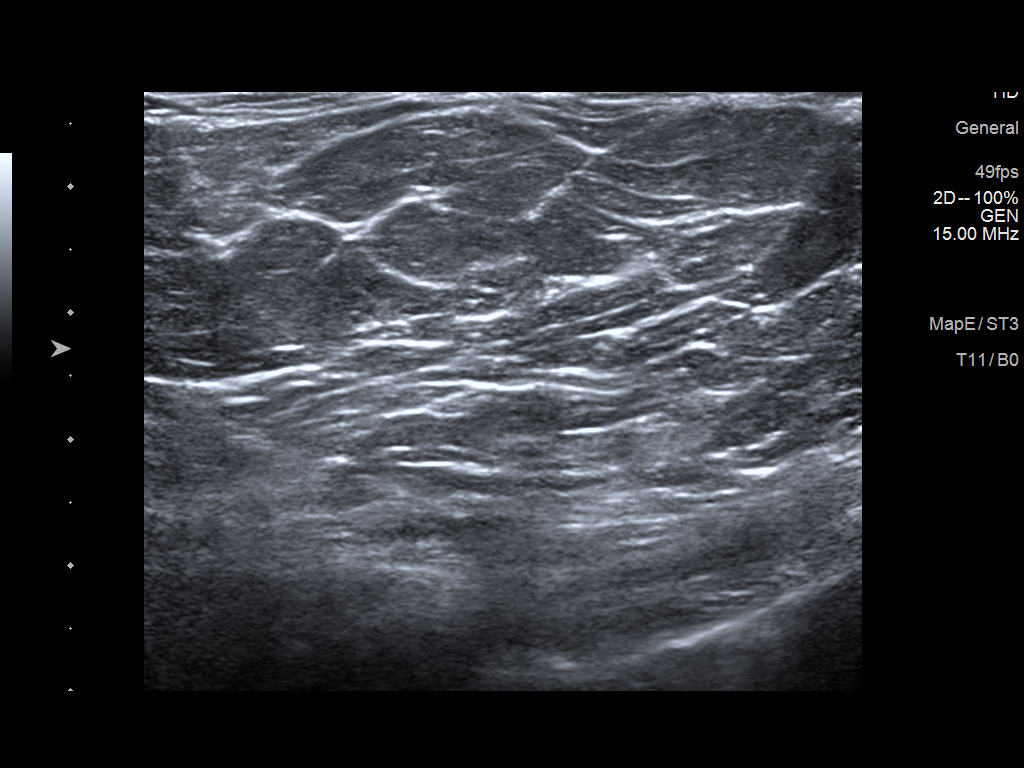

[4 of 4 positions shown; findings below may reference images not displayed]

ACR Breast Density Category b: There are scattered areas of
fibroglandular density.
FINDINGS: No suspicious mass or malignant type microcalcifications identified
in either breast.

Mammographic images were processed with CAD.

On physical exam, I do not palpate a mass in the area of clinical
concern in the lower-inner quadrant of the right breast.

Targeted ultrasound is performed, showing normal tissue in the
lower-inner quadrant of the right breast. No solid or cystic mass,
abnormal shadowing or distortion visualized.
IMPRESSION: No evidence of malignancy in either breast.

RECOMMENDATION:
Bilateral screening mammogram in 1 year is recommended.

I have discussed the findings and recommendations with the patient.
Results were also provided in writing at the conclusion of the
visit. If applicable, a reminder letter will be sent to the patient
regarding the next appointment.

BI-RADS CATEGORY  1: Negative.

## 2019-10-23 IMAGING — US US TRANSVAGINAL NON-OB
1 series · 13 of 25 positions shown · non-contrast
Comparison: Abdomen and pelvis CT obtained earlier today.

CLINICAL DATA: Right flank pain. CT findings suspicious for right
ovarian thrombosis and possible right ovarian torsion earlier today.

EXAM:
TRANSABDOMINAL AND TRANSVAGINAL ULTRASOUND OF PELVIS
DOPPLER ULTRASOUND OF OVARIES
TECHNIQUE: Both transabdominal and transvaginal ultrasound examinations of the
pelvis were performed. Transabdominal technique was performed for
global imaging of the pelvis including uterus, ovaries, adnexal
regions, and pelvic cul-de-sac.
It was necessary to proceed with endovaginal exam following the
transabdominal exam to visualize the right ovary and better
visualize the uterus and left ovary. Color and duplex Doppler
ultrasound was utilized to evaluate blood flow to the ovaries.

[Series 1: us transvaginal non-ob · 0.25mm/px · 56 acquisitions, 13 frames shown]
[im 1/56]
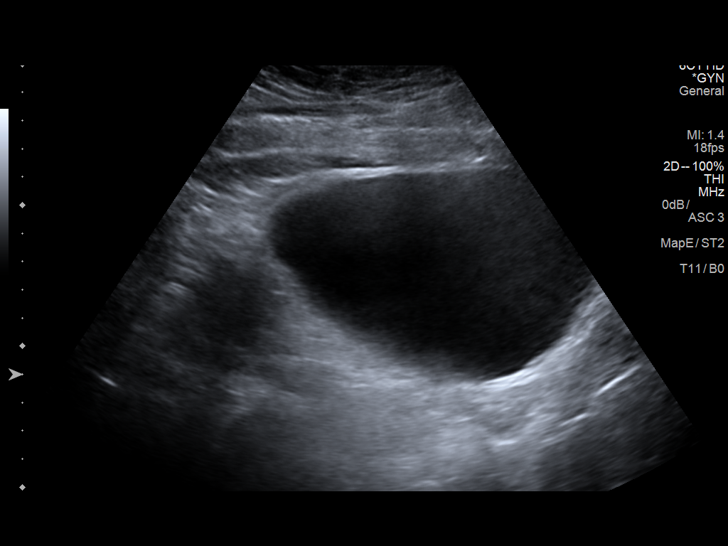
[im 5/56]
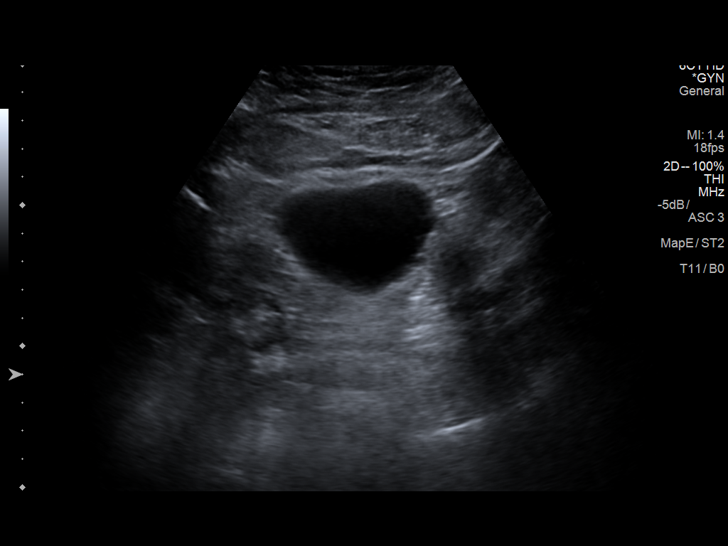
[im 10/56]
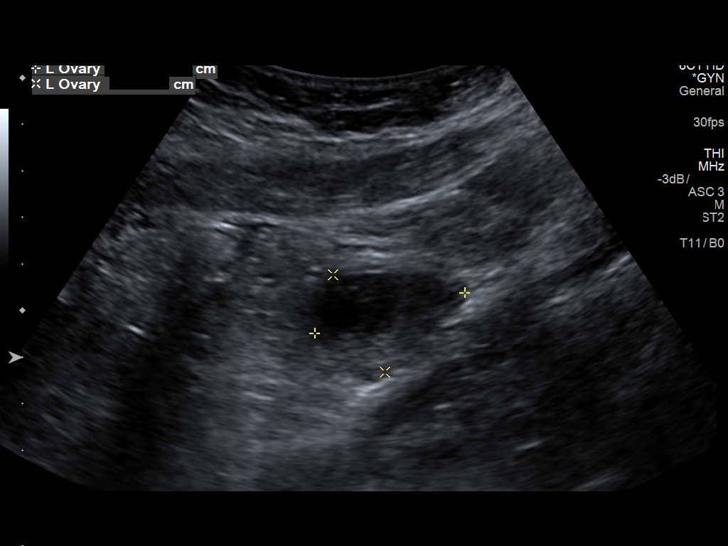
[im 14/56]
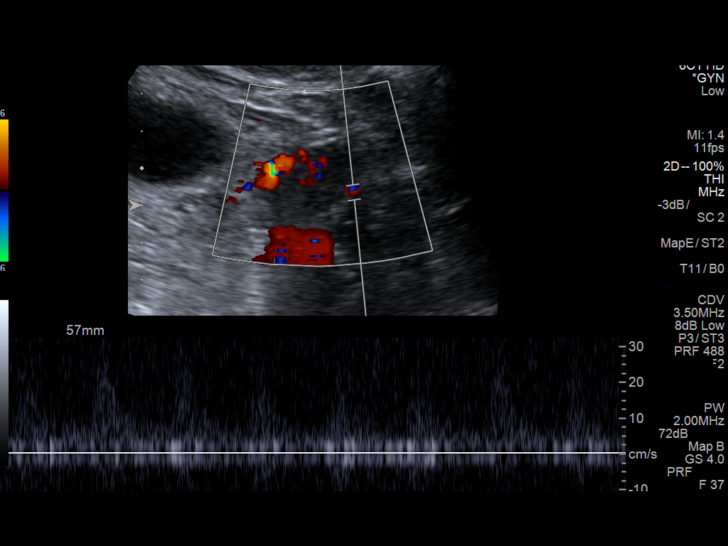
[im 19/56]
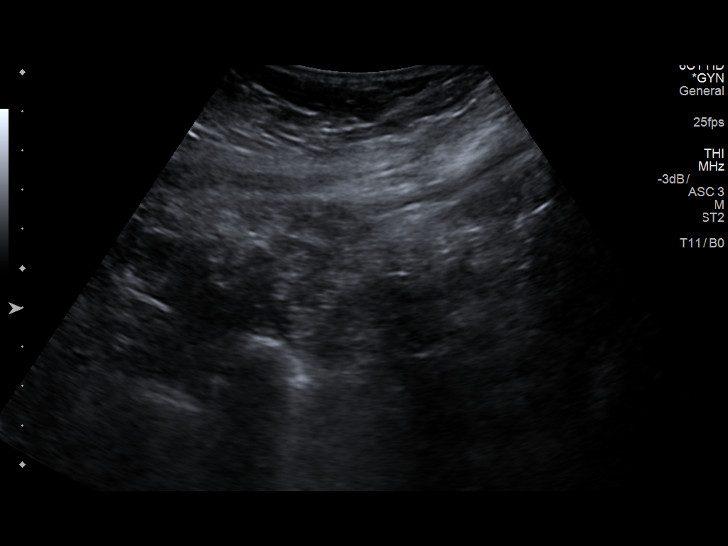
[im 23/56]
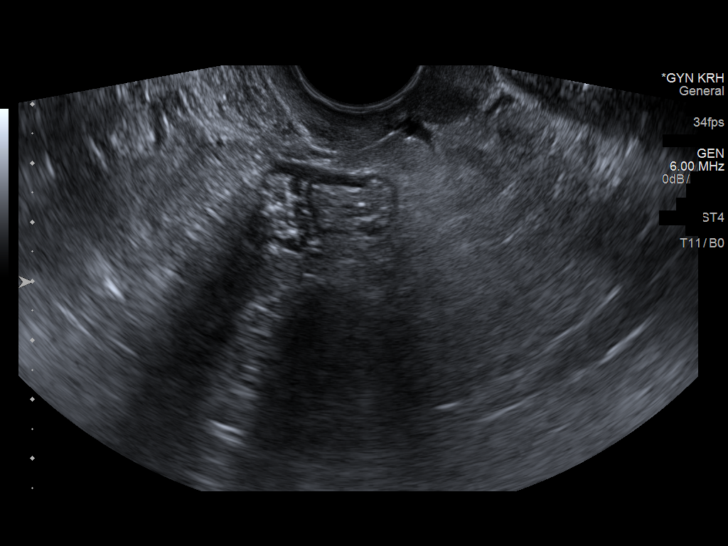
[im 28/56]
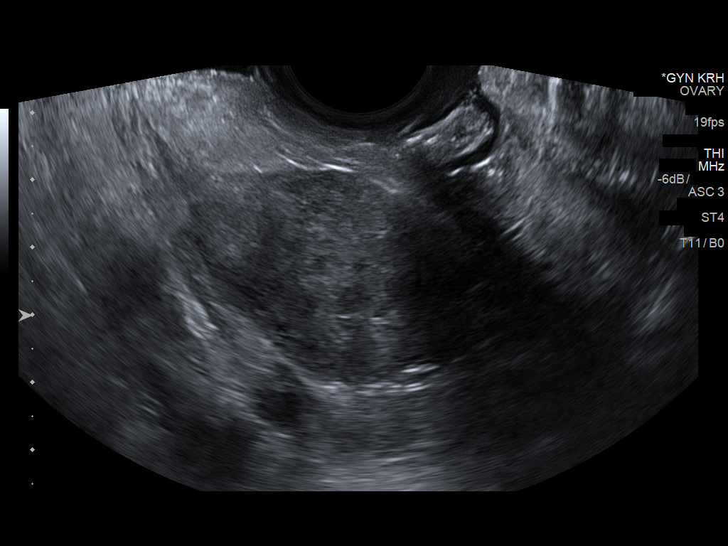
[im 33/56]
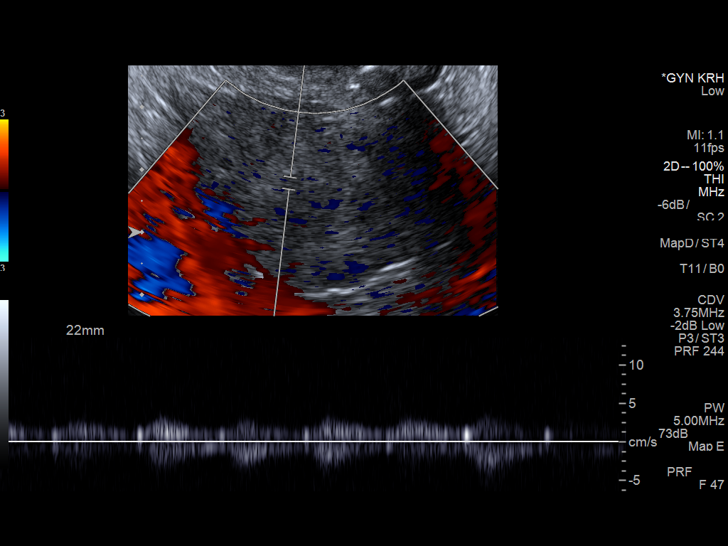
[im 37/56]
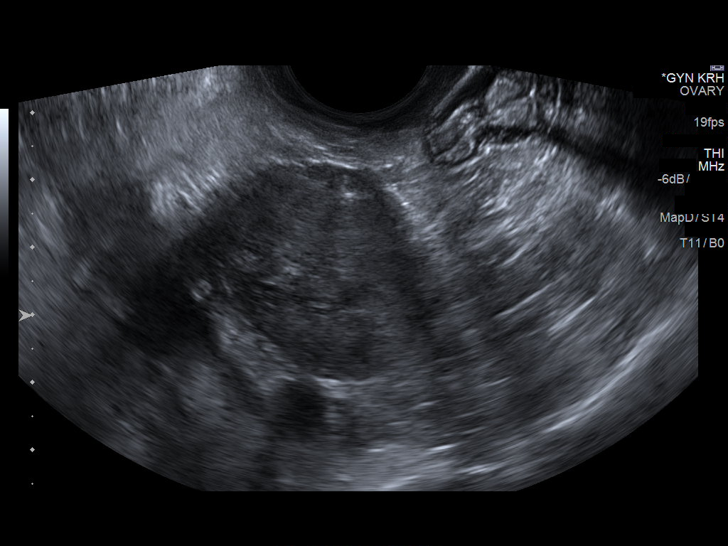
[im 42/56]
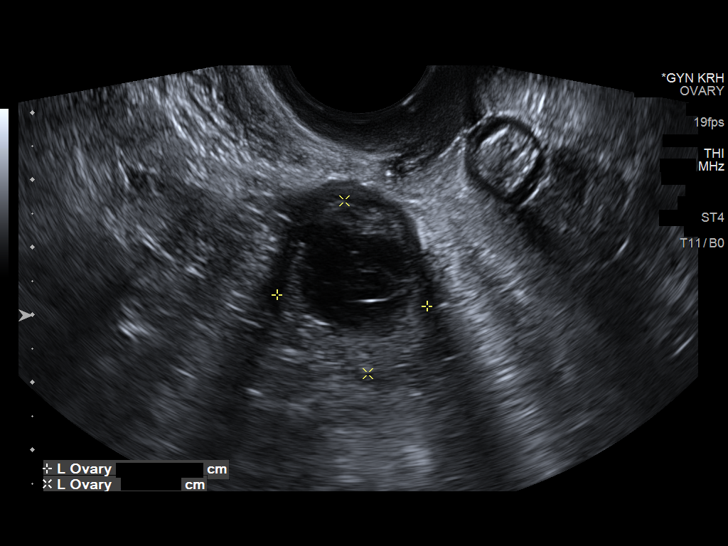
[im 46/56]
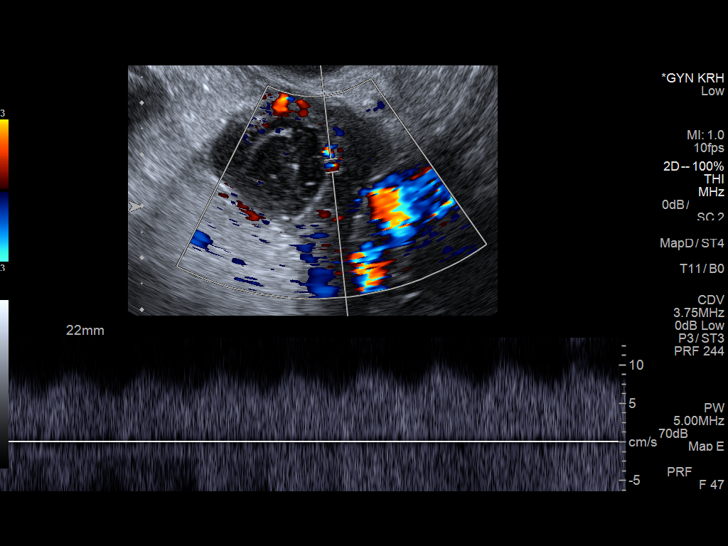
[im 51/56]
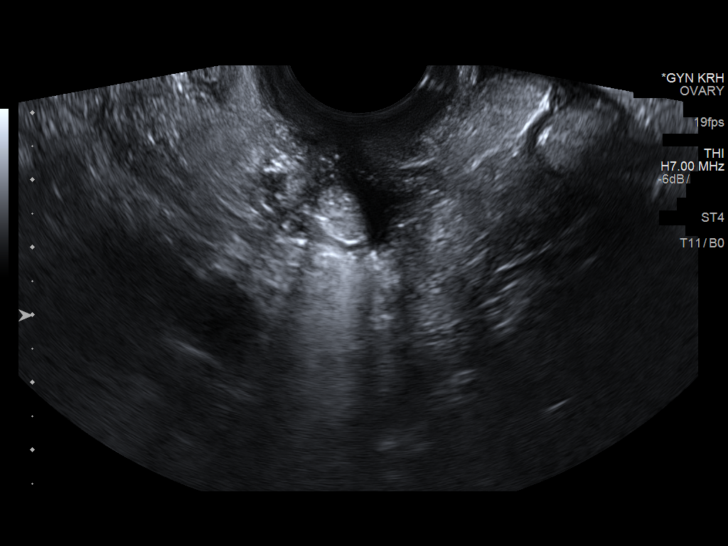
[im 56/56]
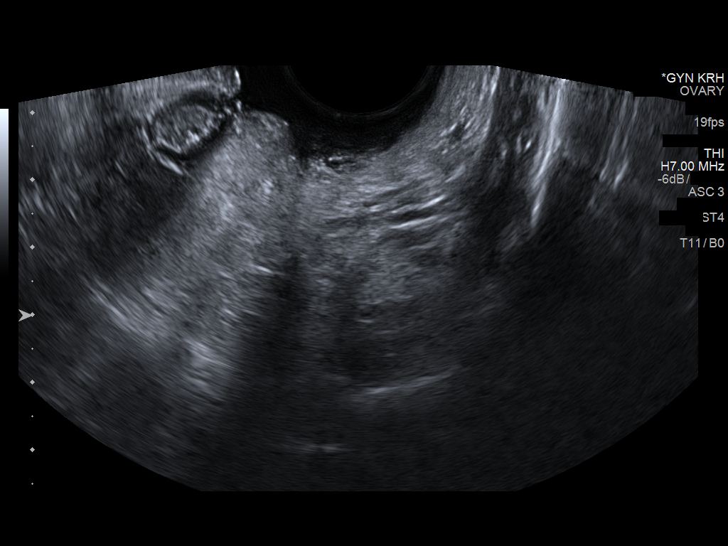

[13 of 25 positions shown; findings below may reference images not displayed]

FINDINGS: Uterus

Surgically absent.

Right ovary

Measurements: 4.5 x 3.4 x 2.9 cm. Mildly heterogeneous. Almost no
internal blood flow with color Doppler. Only a tiny amount of was
seen in the central portion of the ovary.

Left ovary

Measurements: 3.4 x 2.6 x 2.2 cm. 2.4 x 2.1 x 2.0 cm oval cyst
containing heterogeneous internal echoes. No internal blood flow
within the cyst with color Doppler.

Pulsed Doppler evaluation of both ovaries demonstrates normal
low-resistance arterial and venous waveforms in the left ovary.
There was minimal internal arterial and venous flow within the
central right ovary.

Other findings

Trace amount of free peritoneal fluid adjacent to the right ovary.
IMPRESSION: 1. Findings concerning for right ovarian torsion with near-complete
absence of blood flow in the ovary.
2. 2.4 cm complex left ovarian cyst. This most likely represents a
hemorrhagic cyst. An endometrioma could also have this appearance. A
follow-up pelvic ultrasound is recommended in 3 months.
3. Status post hysterectomy.

Critical Value/emergent results were called by telephone at the time
of interpretation on 11/21/2017 at [DATE] to Erxleben, Ferienhaus,
who verbally acknowledged these results.
